# Patient Record
Sex: Male | Born: 1955 | Race: White | Hispanic: No | Marital: Single | Smoking: Former smoker
Health system: Southern US, Community
[De-identification: ages and names within clinical notes are randomized; demographics above are authoritative.]

## PROBLEM LIST (undated history)

## (undated) HISTORY — PX: SHOULDER ACROMIOPLASTY: SHX6093

## (undated) HISTORY — PX: KNEE ARTHROSCOPY W/ MENISCAL REPAIR: SHX1877

## (undated) HISTORY — PX: HERNIA REPAIR: SHX51

---

## 2004-12-19 ENCOUNTER — Encounter: Admission: RE | Admit: 2004-12-19 | Discharge: 2004-12-19 | Payer: Self-pay | Admitting: Gastroenterology

## 2005-01-19 ENCOUNTER — Ambulatory Visit (HOSPITAL_COMMUNITY): Admission: RE | Admit: 2005-01-19 | Discharge: 2005-01-19 | Payer: Self-pay | Admitting: Gastroenterology

## 2005-01-26 ENCOUNTER — Ambulatory Visit (HOSPITAL_COMMUNITY): Admission: RE | Admit: 2005-01-26 | Discharge: 2005-01-26 | Payer: Self-pay | Admitting: Gastroenterology

## 2012-07-24 ENCOUNTER — Ambulatory Visit (INDEPENDENT_AMBULATORY_CARE_PROVIDER_SITE_OTHER): Payer: BC Managed Care – PPO | Admitting: Family Medicine

## 2012-07-24 ENCOUNTER — Encounter: Payer: Self-pay | Admitting: Family Medicine

## 2012-07-24 VITALS — BP 108/71 | HR 52 | Temp 96.8°F | Resp 16 | Ht 73.0 in | Wt 170.0 lb

## 2012-07-24 DIAGNOSIS — Z0000 Encounter for general adult medical examination without abnormal findings: Secondary | ICD-10-CM

## 2012-07-24 LAB — POCT URINALYSIS DIPSTICK
Blood, UA: NEGATIVE
Glucose, UA: NEGATIVE
Leukocytes, UA: NEGATIVE
Nitrite, UA: NEGATIVE
Protein, UA: NEGATIVE
Spec Grav, UA: 1.025
Urobilinogen, UA: 0.2
pH, UA: 5.5

## 2012-07-24 LAB — IFOBT (OCCULT BLOOD): IFOBT: NEGATIVE

## 2012-07-24 NOTE — Patient Instructions (Signed)
Health Maintenance, Males A healthy lifestyle and preventative care can promote health and wellness.  Maintain regular health, dental, and eye exams.  Eat a healthy diet. Foods like vegetables, fruits, whole grains, low-fat dairy products, and lean protein foods contain the nutrients you need without too many calories. Decrease your intake of foods high in solid fats, added sugars, and salt. Get information about a proper diet from your caregiver, if necessary.  Regular physical exercise is one of the most important things you can do for your health. Most adults should get at least 150 minutes of moderate-intensity exercise (any activity that increases your heart rate and causes you to sweat) each week. In addition, most adults need muscle-strengthening exercises on 2 or more days a week.   Maintain a healthy weight. The body mass index (BMI) is a screening tool to identify possible weight problems. It provides an estimate of body fat based on height and weight. Your caregiver can help determine your BMI, and can help you achieve or maintain a healthy weight. For adults 20 years and older:  A BMI below 18.5 is considered underweight.  A BMI of 18.5 to 24.9 is normal.  A BMI of 25 to 29.9 is considered overweight.  A BMI of 30 and above is considered obese.  Maintain normal blood lipids and cholesterol by exercising and minimizing your intake of saturated fat. Eat a balanced diet with plenty of fruits and vegetables. Blood tests for lipids and cholesterol should begin at age 20 and be repeated every 5 years. If your lipid or cholesterol levels are high, you are over 50, or you are a high risk for heart disease, you may need your cholesterol levels checked more frequently.Ongoing high lipid and cholesterol levels should be treated with medicines, if diet and exercise are not effective.  If you smoke, find out from your caregiver how to quit. If you do not use tobacco, do not start.  If you  choose to drink alcohol, do not exceed 2 drinks per day. One drink is considered to be 12 ounces (355 mL) of beer, 5 ounces (148 mL) of wine, or 1.5 ounces (44 mL) of liquor.  Avoid use of street drugs. Do not share needles with anyone. Ask for help if you need support or instructions about stopping the use of drugs.  High blood pressure causes heart disease and increases the risk of stroke. Blood pressure should be checked at least every 1 to 2 years. Ongoing high blood pressure should be treated with medicines if weight loss and exercise are not effective.  If you are 45 to 58 years old, ask your caregiver if you should take aspirin to prevent heart disease.  Diabetes screening involves taking a blood sample to check your fasting blood sugar level. This should be done once every 3 years, after age 45, if you are within normal weight and without risk factors for diabetes. Testing should be considered at a younger age or be carried out more frequently if you are overweight and have at least 1 risk factor for diabetes.  Colorectal cancer can be detected and often prevented. Most routine colorectal cancer screening begins at the age of 50 and continues through age 75. However, your caregiver may recommend screening at an earlier age if you have risk factors for colon cancer. On a yearly basis, your caregiver may provide home test kits to check for hidden blood in the stool. Use of a small camera at the end of a tube,   to directly examine the colon (sigmoidoscopy or colonoscopy), can detect the earliest forms of colorectal cancer. Talk to your caregiver about this at age 50, when routine screening begins. Direct examination of the colon should be repeated every 5 to 10 years through age 75, unless early forms of pre-cancerous polyps or small growths are found.  Hepatitis C blood testing is recommended for all people born from 1945 through 1965 and any individual with known risks for hepatitis C.  Healthy  men should no longer receive prostate-specific antigen (PSA) blood tests as part of routine cancer screening. Consult with your caregiver about prostate cancer screening.  Testicular cancer screening is not recommended for adolescents or adult males who have no symptoms. Screening includes self-exam, caregiver exam, and other screening tests. Consult with your caregiver about any symptoms you have or any concerns you have about testicular cancer.  Practice safe sex. Use condoms and avoid high-risk sexual practices to reduce the spread of sexually transmitted infections (STIs).  Use sunscreen with a sun protection factor (SPF) of 30 or greater. Apply sunscreen liberally and repeatedly throughout the day. You should seek shade when your shadow is shorter than you. Protect yourself by wearing long sleeves, pants, a wide-brimmed hat, and sunglasses year round, whenever you are outdoors.  Notify your caregiver of new moles or changes in moles, especially if there is a change in shape or color. Also notify your caregiver if a mole is larger than the size of a pencil eraser.  A one-time screening for abdominal aortic aneurysm (AAA) and surgical repair of large AAAs by sound wave imaging (ultrasonography) is recommended for ages 65 to 75 years who are current or former smokers.  Stay current with your immunizations. Document Released: 10/13/2007 Document Revised: 07/09/2011 Document Reviewed: 09/11/2010 [employer] Patient Information 2013 [employer].  

## 2012-07-24 NOTE — Progress Notes (Signed)
Patient ID: Corey Lane MRN: [id number], DOB: 01-Apr-1956 57 y.o. Date of Encounter: 07/24/2012, 9:20 AM  Primary Physician: No primary provider on file.  Chief Complaint: Physical (CPE)  HPI: 57 y.o. y/o male with history noted below here for CPE.  Doing well.  H/O migraines (very occasional, uses Maxalt MLT prn) 3-4 per year.  Onset at 57 years old  Colonoscopy:  7 years ago Last dt:  Unknown Eyes:  [place] Seasonal affective disorder:  Treated by Dr. Len Blalock (buproprion 150 during summer, 300 during winter)   Review of Systems: Consitutional: No fever, chills, fatigue, night sweats, lymphadenopathy, or weight changes. Eyes: No visual changes, eye redness, or discharge. ENT/Mouth: Ears: No otalgia, tinnitus, hearing loss, discharge. Nose: No congestion, rhinorrhea, sinus pain, or epistaxis. Throat: No sore throat, post nasal drip, or teeth pain. Cardiovascular: No CP, palpitations, diaphoresis, DOE, edema, orthopnea, PND. Respiratory: No cough, hemoptysis, SOB, or wheezing. Gastrointestinal: No anorexia, dysphagia, reflux, pain, nausea, vomiting, hematemesis, diarrhea, BRBPR, or melena.  BM q 3 days when on buproprion Genitourinary: No dysuria, frequency, urgency, hematuria, incontinence, nocturia, decreased urinary stream, discharge, impotence, or testicular pain/masses. Musculoskeletal: No decreased ROM, myalgias, stiffness, joint swelling, or weakness. Skin: No rash, erythema, lesion changes, pain, warmth, jaundice, or pruritis. Neurological: No headache, dizziness, syncope, seizures, tremors, memory loss, coordination problems, or paresthesias. Psychological: No anxiety, depression, hallucinations, SI/HI. Endocrine: No fatigue, polydipsia, polyphagia, polyuria, or known diabetes. All other systems were reviewed and are otherwise negative.  No past medical history on file.   Past Surgical History  Procedure Laterality Date  . Knee arthroscopy w/ meniscal repair       Home Meds:  Prior to Admission medications   Medication Sig Start Date End Date Taking? Authorizing Provider  buPROPion (WELLBUTRIN XL) 300 MG 24 hr tablet Take 300 mg by mouth daily.   Yes Historical Provider, MD    Allergies: No Known Allergies  History   Social History  . Marital Status: Single    Spouse Name: N/A    Number of Children: N/A  . Years of Education: N/A   Occupational History  . Physiological scientist    Social History Main Topics  . Smoking status: Former Games developer  . Smokeless tobacco: Not on file  . Alcohol Use: Yes  . Drug Use: No  . Sexually Active: Not on file   Other Topics Concern  . Not on file   Social History Narrative  . No narrative on file    No family history on file.  Physical Exam: Blood pressure 108/71, pulse 52, temperature 96.8 F (36 C), resp. rate 16, height 6\' 1"  (1.854 m), weight 170 lb (77.111 kg).  General: Well developed, well nourished, in no acute distress. HEENT: Normocephalic, atraumatic. Conjunctiva pink, sclera non-icteric. Pupils 2 mm constricting to 1 mm, round, regular, and equally reactive to light and accomodation. EOMI. Internal auditory canal clear. TMs with good cone of light and without pathology. Nasal mucosa pink. Nares are without discharge. No sinus tenderness. Oral mucosa pink. Dentition good. Pharynx without exudate.   Neck: Supple. Trachea midline. No thyromegaly. Full ROM. No lymphadenopathy. Lungs: Clear to auscultation bilaterally without wheezes, rales, or rhonchi. Breathing is of normal effort and unlabored. Cardiovascular: RRR with S1 S2. No murmurs, rubs, or gallops appreciated. Distal pulses 2+ symmetrically. No carotid or abdominal bruits Abdomen: Soft, non-tender, non-distended with normoactive bowel sounds. No hepatosplenomegaly or masses. No rebound/guarding. No CVA tenderness. Without hernias.  Rectal: No external hemorrhoids or  fissures. Rectal vault without masses.  Genitourinary:   circumcised male. No penile lesions. Testes descended bilaterally, and smooth without tenderness or masses.  Musculoskeletal: Full range of motion and 5/5 strength throughout. Without swelling, atrophy, tenderness, crepitus, or warmth. Extremities without clubbing, cyanosis, or edema. Calves supple. Skin: Warm and moist without erythema, ecchymosis, wounds, or rash. Neuro: A+Ox3. CN II-XII grossly intact. Moves all extremities spontaneously. Full sensation throughout. Normal gait. DTR 2+ throughout upper and lower extremities. Finger to nose intact. Psych:  Responds to questions appropriately with a normal affect.    UA:  Results for orders placed in visit on 07/24/12  POCT URINALYSIS DIPSTICK      Result Value Range   Color, UA yellow     Clarity, UA clear     Glucose, UA neg     Bilirubin, UA small     Ketones, UA trace     Spec Grav, UA 1.025     Blood, UA neg     pH, UA 5.5     Protein, UA neg     Urobilinogen, UA 0.2     Nitrite, UA neg     Leukocytes, UA Negative    IFOBT (OCCULT BLOOD)      Result Value Range   IFOBT Negative       Assessment/Plan:  57 y.o. y/o  male here for CPE -Routine general medical examination at a health care facility - Plan: POCT urinalysis dipstick, IFOBT POC (occult bld, rslt in office), CANCELED: CBC, CANCELED: Comprehensive metabolic panel, CANCELED: Lipid panel, CANCELED: PSA    Signed, Elvina Sidle, MD 07/24/2012 9:20 AM

## 2014-05-05 ENCOUNTER — Ambulatory Visit (INDEPENDENT_AMBULATORY_CARE_PROVIDER_SITE_OTHER): Payer: BLUE CROSS/BLUE SHIELD | Admitting: Family Medicine

## 2014-05-05 VITALS — BP 126/80 | HR 71 | Temp 98.0°F | Resp 17 | Ht 72.5 in | Wt 179.0 lb

## 2014-05-05 DIAGNOSIS — J0110 Acute frontal sinusitis, unspecified: Secondary | ICD-10-CM

## 2014-05-05 MED ORDER — AMOXICILLIN 500 MG PO CAPS: 1000.0000 mg | ORAL_CAPSULE | Freq: Two times a day (BID) | ORAL | Status: AC

## 2014-05-05 NOTE — Progress Notes (Signed)
[facility] [address] [place] [address] [phone]  Date:  05/05/2014   Name:  Corey Lane   DOB:  August 04, 1955   MRN:  [id number]  PCP:  No primary care provider on file.    Chief Complaint: Sinusitis; Dental Pain; Ear Fullness; and Generalized Body Aches   History of Present Illness:  Corey Lane is a 59 y.o. very pleasant male patient who presents with the following:  He has been ill for a couple of weeks with sinus pressure and pain, pain in his teeth, and joint aches.  The sx have moved from his throat, to his chest and now settled in his sinuses over the last week.   He is blowing out dark green phelgm.  He feels feverish but his temp has been normal  His sx are worst at night No cough- he did have a cough but this is now resolved No GI symptoms He has noted fatigue, he is not sleeping well   His wife has been ill recently as well  There are no active problems to display for this patient.   No past medical history on file.  Past Surgical History  Procedure Laterality Date  . Knee arthroscopy w/ meniscal repair      History  Substance Use Topics  . Smoking status: Former Research scientist (life sciences)  . Smokeless tobacco: Not on file  . Alcohol Use: Yes    No family history on file.  No Known Allergies  Medication list has been reviewed and updated.  No current outpatient prescriptions on file prior to visit.   No current facility-administered medications on file prior to visit.    Review of Systems:  As per HPI- otherwise negative.   Physical Examination: Filed Vitals:   05/05/14 1524  BP: 126/80  Pulse: 71  Temp: 98 F (36.7 C)  Resp: 17   Filed Vitals:   05/05/14 1524  Height: 6' 0.5" (1.842 m)  Weight: 179 lb (81.194 kg)   Body mass index is 23.93 kg/(m^2). Ideal Body Weight: Weight in (lb) to have BMI = 25: 186.5  GEN: WDWN, NAD, Non-toxic, A & O x 3, looks well HEENT: Atraumatic, Normocephalic. Neck supple. No masses,  No LAD.  Bilateral TM wnl, oropharynx normal.  PEERL,EOMI.   Sinuses are tender to percussion, purulent nasal discharge Ears and Nose: No external deformity. CV: RRR, No M/G/R. No JVD. No thrill. No extra heart sounds. PULM: CTA B, no wheezes, crackles, rhonchi. No retractions. No resp. distress. No accessory muscle use. EXTR: No c/c/e NEURO Normal gait.  PSYCH: Normally interactive. Conversant. Not depressed or anxious appearing.  Calm demeanor.    Assessment and Plan: Acute frontal sinusitis, recurrence not specified - Plan: amoxicillin (AMOXIL) 500 MG capsule  Treat for sinus infection with amoxicillin See patient instructions for more details.     Signed Lamar Blinks, MD

## 2014-05-05 NOTE — Patient Instructions (Signed)
We are going to treat you for a sinus infection with amoxicillin- take as directed.  Let me know if you do not feel better in the next few days You might also try some mucinex, ibuprofen for pain

## 2014-09-29 ENCOUNTER — Ambulatory Visit (INDEPENDENT_AMBULATORY_CARE_PROVIDER_SITE_OTHER): Payer: BLUE CROSS/BLUE SHIELD | Admitting: Family Medicine

## 2014-09-29 ENCOUNTER — Ambulatory Visit (HOSPITAL_COMMUNITY): Payer: BLUE CROSS/BLUE SHIELD

## 2014-09-29 ENCOUNTER — Encounter (HOSPITAL_BASED_OUTPATIENT_CLINIC_OR_DEPARTMENT_OTHER): Payer: Self-pay | Admitting: Emergency Medicine

## 2014-09-29 ENCOUNTER — Emergency Department (HOSPITAL_BASED_OUTPATIENT_CLINIC_OR_DEPARTMENT_OTHER): Payer: BLUE CROSS/BLUE SHIELD

## 2014-09-29 ENCOUNTER — Emergency Department (HOSPITAL_BASED_OUTPATIENT_CLINIC_OR_DEPARTMENT_OTHER)
Admission: EM | Admit: 2014-09-29 | Discharge: 2014-09-29 | Disposition: A | Discharge status: Home / Self Care | Payer: BLUE CROSS/BLUE SHIELD | Attending: Emergency Medicine | Admitting: Emergency Medicine

## 2014-09-29 VITALS — BP 138/96 | HR 87 | Temp 97.4°F | Resp 22

## 2014-09-29 DIAGNOSIS — Z79899 Other long term (current) drug therapy: Secondary | ICD-10-CM | POA: Diagnosis not present

## 2014-09-29 DIAGNOSIS — Z87891 Personal history of nicotine dependence: Secondary | ICD-10-CM | POA: Insufficient documentation

## 2014-09-29 DIAGNOSIS — N201 Calculus of ureter: Secondary | ICD-10-CM

## 2014-09-29 DIAGNOSIS — R1031 Right lower quadrant pain: Secondary | ICD-10-CM

## 2014-09-29 DIAGNOSIS — R109 Unspecified abdominal pain: Secondary | ICD-10-CM | POA: Diagnosis present

## 2014-09-29 LAB — COMPREHENSIVE METABOLIC PANEL
ALK PHOS: 61 U/L (ref 38–126)
ALT: 23 U/L (ref 17–63)
ANION GAP: 8 (ref 5–15)
AST: 26 U/L (ref 15–41)
Albumin: 4.2 g/dL (ref 3.5–5.0)
BUN: 19 mg/dL (ref 6–20)
CHLORIDE: 103 mmol/L (ref 101–111)
CO2: 28 mmol/L (ref 22–32)
CREATININE: 1.19 mg/dL (ref 0.61–1.24)
Calcium: 9.2 mg/dL (ref 8.9–10.3)
GFR calc Af Amer: >60 mL/min (ref >60)
GLUCOSE: 110 mg/dL — AB (ref 65–99)
POTASSIUM: 4.4 mmol/L (ref 3.5–5.1)
Sodium: 139 mmol/L (ref 135–145)
Total Bilirubin: 0.5 mg/dL (ref 0.3–1.2)
Total Protein: 7.1 g/dL (ref 6.5–8.1)

## 2014-09-29 LAB — POCT URINALYSIS DIPSTICK
BILIRUBIN UA: NEGATIVE
GLUCOSE UA: NEGATIVE
NITRITE UA: NEGATIVE
PROTEIN UA: NEGATIVE
Spec Grav, UA: 1.025
UROBILINOGEN UA: 0.2
pH, UA: 5.5

## 2014-09-29 LAB — POCT CBC
GRANULOCYTE PERCENT: 45.9 % (ref 37–80)
HCT, POC: 42.3 % — AB (ref 43.5–53.7)
Hemoglobin: 14 g/dL — AB (ref 14.1–18.1)
Lymph, poc: 3.2 (ref 0.6–3.4)
MCH: 31 pg (ref 27–31.2)
MCHC: 33.2 g/dL (ref 31.8–35.4)
MCV: 93.3 fL (ref 80–97)
MID (cbc): 0.3 (ref 0–0.9)
MPV: 7.6 fL (ref 0–99.8)
PLATELET COUNT, POC: 245 10*3/uL (ref 142–424)
POC Granulocyte: 3 (ref 2–6.9)
POC LYMPH PERCENT: 49.7 %L (ref 10–50)
POC MID %: 4.4 %M (ref 0–12)
RBC: 4.53 M/uL — AB (ref 4.69–6.13)
RDW, POC: 13.6 %
WBC: 6.5 10*3/uL (ref 4.6–10.2)

## 2014-09-29 LAB — POCT UA - MICROSCOPIC ONLY
Casts, Ur, LPF, POC: NEGATIVE
Crystals, Ur, HPF, POC: NEGATIVE
Epithelial cells, urine per micros: NEGATIVE
MUCUS UA: NEGATIVE
YEAST UA: NEGATIVE

## 2014-09-29 LAB — CBC
HCT: 41.3 % (ref 39.0–52.0)
Hemoglobin: 13.9 g/dL (ref 13.0–17.0)
MCH: 31.6 pg (ref 26.0–34.0)
MCHC: 33.7 g/dL (ref 30.0–36.0)
MCV: 93.9 fL (ref 78.0–100.0)
Platelets: 204 10*3/uL (ref 150–400)
RBC: 4.4 MIL/uL (ref 4.22–5.81)
RDW: 12.6 % (ref 11.5–15.5)
WBC: 7.6 10*3/uL (ref 4.0–10.5)

## 2014-09-29 MED ORDER — ONDANSETRON 4 MG PO TBDP: 4.0000 mg | ORAL_TABLET | Freq: Three times a day (TID) | ORAL | Status: AC | PRN

## 2014-09-29 MED ORDER — SODIUM CHLORIDE 0.9 % IV BOLUS (SEPSIS)
1000.0000 mL | Freq: Once | INTRAVENOUS | Status: AC
  Administered 2014-09-29: 1000 mL via INTRAVENOUS

## 2014-09-29 MED ORDER — ONDANSETRON HCL 4 MG/2ML IJ SOLN
4.0000 mg | Freq: Once | INTRAMUSCULAR | Status: AC
  Administered 2014-09-29: 4 mg via INTRAVENOUS

## 2014-09-29 MED ORDER — TAMSULOSIN HCL 0.4 MG PO CAPS: 0.4000 mg | ORAL_CAPSULE | Freq: Every day | ORAL | Status: DC

## 2014-09-29 MED ORDER — FENTANYL CITRATE (PF) 100 MCG/2ML IJ SOLN
50.0000 ug | Freq: Once | INTRAMUSCULAR | Status: AC
  Administered 2014-09-29: 50 ug via INTRAVENOUS

## 2014-09-29 MED ORDER — FENTANYL CITRATE (PF) 100 MCG/2ML IJ SOLN
INTRAMUSCULAR | Status: AC
  Filled 2014-09-29: qty 2

## 2014-09-29 MED ORDER — OXYCODONE-ACETAMINOPHEN 5-325 MG PO TABS: 1.0000 | ORAL_TABLET | Freq: Four times a day (QID) | ORAL | Status: AC | PRN

## 2014-09-29 MED ORDER — ONDANSETRON HCL 4 MG/2ML IJ SOLN
INTRAMUSCULAR | Status: AC
  Filled 2014-09-29: qty 2

## 2014-09-29 MED ORDER — HYDROMORPHONE HCL 1 MG/ML IJ SOLN
1.0000 mg | Freq: Once | INTRAMUSCULAR | Status: AC
  Administered 2014-09-29: 1 mg via INTRAVENOUS
  Filled 2014-09-29: qty 1

## 2014-09-29 MED ORDER — HYDROMORPHONE HCL 1 MG/ML IJ SOLN
INTRAMUSCULAR | Status: AC
  Filled 2014-09-29: qty 1

## 2014-09-29 MED ORDER — HYDROMORPHONE HCL 1 MG/ML IJ SOLN
1.0000 mg | Freq: Once | INTRAMUSCULAR | Status: AC
  Administered 2014-09-29: 1 mg via INTRAVENOUS

## 2014-09-29 MED ORDER — KETOROLAC TROMETHAMINE 30 MG/ML IJ SOLN
30.0000 mg | Freq: Once | INTRAMUSCULAR | Status: AC
  Administered 2014-09-29: 30 mg via INTRAMUSCULAR

## 2014-09-29 NOTE — ED Provider Notes (Signed)
CSN: [id number]     Arrival date & time 09/29/14  1353 History   First MD Initiated Contact with Patient 09/29/14 1451     Chief Complaint  Patient presents with  . Flank Pain    right side     (Consider location/radiation/quality/duration/timing/severity/associated sxs/prior Treatment) HPI Comments: Denies history of stone  Patient is a 59 y.o. male presenting with flank pain. No language interpreter was used.  Flank Pain This is a new problem. The current episode started today. The problem occurs constantly. The problem has been unchanged. Associated symptoms include abdominal pain. Pertinent negatives include no fever, numbness, vomiting or weakness. Nothing aggravates the symptoms. He has tried nothing for the symptoms.    History reviewed. No pertinent past medical history. Past Surgical History  Procedure Laterality Date  . Knee arthroscopy w/ meniscal repair    . Hernia repair    . Shoulder acromioplasty Left    History reviewed. No pertinent family history. History  Substance Use Topics  . Smoking status: Former Research scientist (life sciences)  . Smokeless tobacco: Not on file  . Alcohol Use: Yes    Review of Systems  Constitutional: Negative for fever.  Gastrointestinal: Positive for abdominal pain. Negative for vomiting.  Genitourinary: Positive for flank pain.  Neurological: Negative for weakness and numbness.  All other systems reviewed and are negative.     Allergies  Review of patient's allergies indicates no known allergies.  Home Medications   Prior to Admission medications   Medication Sig Start Date End Date Taking? Authorizing Provider  amoxicillin (AMOXIL) 500 MG capsule Take 2 capsules (1,000 mg total) by mouth 2 (two) times daily. Patient not taking: Reported on 09/29/2014 05/05/14   Darreld Mclean, MD  oxyCODONE-acetaminophen (ROXICET) 5-325 MG per tablet Take 1-2 tablets by mouth every 6 (six) hours as needed for severe pain. 09/29/14   Darreld Mclean, MD   tamsulosin (FLOMAX) 0.4 MG CAPS capsule Take 1 capsule (0.4 mg total) by mouth daily. 09/29/14   Gay Filler Copland, MD   BP 125/65 mmHg  Pulse 48  Temp(Src) 98.1 F (36.7 C) (Oral)  Resp 18  Ht 6\' 2"  (1.88 m)  Wt 170 lb (77.111 kg)  BMI 21.82 kg/m2  SpO2 100% Physical Exam  Constitutional: He is oriented to person, place, and time. He appears well-developed and well-nourished. He appears distressed.  Cardiovascular: Normal rate and regular rhythm.   Pulmonary/Chest: Effort normal and breath sounds normal.  Abdominal: Soft. Bowel sounds are normal. There is no tenderness.  Musculoskeletal: Normal range of motion.  Neurological: He is alert and oriented to person, place, and time.  Skin: Skin is warm and dry.  Psychiatric: He has a normal mood and affect.  Nursing note and vitals reviewed.   ED Course  Procedures (including critical care time) Labs Review Labs Reviewed  COMPREHENSIVE METABOLIC PANEL - Abnormal; Notable for the following:    Glucose, Bld 110 (*)    All other components within normal limits  CBC  URINALYSIS, ROUTINE W REFLEX MICROSCOPIC (NOT AT [facility])    Imaging Review Ct Renal Stone Study  09/29/2014   CLINICAL DATA:  Right flank pain with nausea for 1 day  EXAM: CT ABDOMEN AND PELVIS WITHOUT CONTRAST  TECHNIQUE: Multidetector CT imaging of the abdomen and pelvis was performed following the standard protocol without oral or intravenous contrast maternal administration.  COMPARISON:  December 19, 2004  FINDINGS: Lung bases are clear.  Liver is prominent, measuring 18.9 cm in length. No focal  liver lesions are identified on this noncontrast enhanced study. Gallbladder wall is not thickened. There is no appreciable biliary duct dilatation.  Spleen, pancreas, and adrenals appear normal.  Left kidney shows no evidence of mass or hydronephrosis. There is an extrarenal pelvis the left, stable. There is no left-sided renal or ureteral calculus. On the right, there is an  extrarenal pelvis. There is moderate hydronephrosis on the right as well. No right renal mass. There is no intrarenal calculus on the right. There is a 2 mm calculus in the right ureterovesical junction. Several vascular calcifications are also present on the right, separate from the right ureter.  In the pelvis, the urinary bladder is midline with normal wall thickness. There are prostatic calculi. Prostate appears mildly prominent. There is no pelvic mass or pelvic fluid collection. There is no periappendiceal region inflammation. Appendix is not well seen.  There is diffuse stool throughout the colon.  There is no bowel obstruction.  No free air or portal venous air.  There is no appreciable ascites, adenopathy, or abscess in the abdomen or pelvis. There is no abdominal aortic aneurysm. There is degenerative change in the lumbar spine. There is slight anterolisthesis of L3 on L4. There is vacuum phenomenon at L4-5. There are no blastic or lytic bone lesions.  IMPRESSION: 2 mm calculus right ureterovesical junction with moderate hydronephrosis on the right.  Extensive stool throughout colon consistent with constipation.  No bowel obstruction. No abscess. Periappendiceal region appears within normal limits.  Multiple prostatic calculi. Prostate appears rather prominent. This finding may warrant correlation with PSA.   Electronically Signed   By: Lowella Grip III M.D.   On: 09/29/2014 15:20     EKG Interpretation None      MDM   Final diagnoses:  Ureteral stone    Pt is feeling better a this time. Discussed ct findings with pt including enlarged prostate. Pt was given flomax and oxycodone by urgent care earlier today. Given zofran script here and urology follow up    Glendell Docker, NP 09/29/14 [place], MD 09/29/14 1735

## 2014-09-29 NOTE — Discharge Instructions (Signed)

## 2014-09-29 NOTE — Progress Notes (Addendum)
[facility] [address] [place] [address] [phone]  Date:  09/29/2014   Name:  Corey Lane   DOB:  1955-12-21   MRN:  [id number]  PCP:  No primary care provider on file.    Chief Complaint: Abdominal Pain   History of Present Illness:  Corey Lane is a 59 y.o. very pleasant male patient who presents with the following:  Here today with pain in his RLQ. He woke up just before 5 am with mild pain.  He went about his day, pain was mild through the morning but then got worse as the day went on.  He was in a lot of pain over the last hour.  It will come and go, but can get very severe.  It seems to come in "waves."   Yesterday everytrhing seemed to be ok No history of kidney stone in the past He did not eat today- he did not feel like eating No vomiting He did have a left inguinal hernia repaire about 10 years ago- OW no abd operations No hematuria, no pain with urination.    He is generally in good health.   He used ibuprofen 800 about 9:30am.  Discussed with pharm D- ok to use toradol as he does not take the ibuprofen on a regular basis  Wt Readings from Last 3 Encounters:  05/05/14 179 lb (81.194 kg)  07/24/12 170 lb (77.111 kg)   Most recent BMI 23   There are no active problems to display for this patient.   No past medical history on file.  Past Surgical History  Procedure Laterality Date  . Knee arthroscopy w/ meniscal repair      History  Substance Use Topics  . Smoking status: Former Research scientist (life sciences)  . Smokeless tobacco: Not on file  . Alcohol Use: Yes    No family history on file.  No Known Allergies  Medication list has been reviewed and updated.  Current Outpatient Prescriptions on File Prior to Visit  Medication Sig Dispense Refill  . amoxicillin (AMOXIL) 500 MG capsule Take 2 capsules (1,000 mg total) by mouth 2 (two) times daily. (Patient not taking: Reported on 09/29/2014) 40 capsule 0   No current facility-administered  medications on file prior to visit.    Review of Systems:  As per HPI- otherwise negative.   Physical Examination: Filed Vitals:   09/29/14 1203  BP: 138/96  Pulse: 87  Resp: 22   There were no vitals filed for this visit. There is no weight on file to calculate BMI. Ideal Body Weight:    GEN: WDWN, NAD, Non-toxic, A & O x 3, normal weight, appears well except for obvious pain HEENT: Atraumatic, Normocephalic. Neck supple. No masses, No LAD. Ears and Nose: No external deformity. CV: RRR, No M/G/R. No JVD. No thrill. No extra heart sounds. PULM: CTA B, no wheezes, crackles, rhonchi. No retractions. No resp. distress. No accessory muscle use. ABD: S,ND, +BS. No rebound. No HSM.  Mild tenderness in the RLQ- however when he does have pain it comes in severe waves and he is writhing, cannot be examined during wave of pain.  He also endorses some pain into his back  EXTR: No c/c/e NEURO Normal gait.  PSYCH: Normally interactive. Conversant. Not depressed or anxious appearing.  Calm demeanor.  GU: no testicular masses or tenderness. No inguinal hernia appreciated   Given toradol 30 mg in clinic. However this did not relieve his pain really  at all.  We do not have stronger pain relievers here at clinic.  Had planned to send him for an outpt CT but decided to have him go to the ER for further treatment instead given his pain.   Assessment and Plan: RLQ abdominal pain - Plan: POCT CBC, POCT UA - Microscopic Only, POCT urinalysis dipstick, Urine culture, Comprehensive metabolic panel, ketorolac (TORADOL) 30 MG/ML injection 30 mg, tamsulosin (FLOMAX) 0.4 MG CAPS capsule, oxyCODONE-acetaminophen (ROXICET) 5-325 MG per tablet, CT Abdomen Pelvis Wo Contrast  Severe spasmodic RLQ pain- suspect a kidney stone given nature of his pain, does not seem typical of appendicitis.  Gave him toradol which did not help his pain.   He will go to the ER for pain relief and further evaluation of his pain.     Signed Lamar Blinks, MD  Called 10/02/14 and [facility]- labs look ok, will mail him a copy  Results for orders placed or performed in visit on 09/29/14  Urine culture  Result Value Ref Range   Colony Count NO GROWTH    Organism ID, Bacteria NO GROWTH   Comprehensive metabolic panel  Result Value Ref Range   Sodium 138 135 - 145 mEq/L   Potassium 4.6 3.5 - 5.3 mEq/L   Chloride 104 96 - 112 mEq/L   CO2 25 19 - 32 mEq/L   Glucose, Bld 95 70 - 99 mg/dL   BUN 18 6 - 23 mg/dL   Creat 1.12 0.50 - 1.35 mg/dL   Total Bilirubin 0.5 0.2 - 1.2 mg/dL   Alkaline Phosphatase 64 39 - 117 U/L   AST 25 0 - 37 U/L   ALT 20 0 - 53 U/L   Total Protein 6.5 6.0 - 8.3 g/dL   Albumin 4.1 3.5 - 5.2 g/dL   Calcium 10.0 8.4 - 10.5 mg/dL  POCT CBC  Result Value Ref Range   WBC 6.5 4.6 - 10.2 K/uL   Lymph, poc 3.2 0.6 - 3.4   POC LYMPH PERCENT 49.7 10 - 50 %L   MID (cbc) 0.3 0 - 0.9   POC MID % 4.4 0 - 12 %M   POC Granulocyte 3.0 2 - 6.9   Granulocyte percent 45.9 37 - 80 %G   RBC 4.53 (A) 4.69 - 6.13 M/uL   Hemoglobin 14.0 (A) 14.1 - 18.1 g/dL   HCT, POC 42.3 (A) 43.5 - 53.7 %   MCV 93.3 80 - 97 fL   MCH, POC 31.0 27 - 31.2 pg   MCHC 33.2 31.8 - 35.4 g/dL   RDW, POC 13.6 %   Platelet Count, POC 245 142 - 424 K/uL   MPV 7.6 0 - 99.8 fL  POCT UA - Microscopic Only  Result Value Ref Range   WBC, Ur, HPF, POC 0-1    RBC, urine, microscopic 1-3    Bacteria, U Microscopic trace    Mucus, UA neg    Epithelial cells, urine per micros neg    Crystals, Ur, HPF, POC neg    Casts, Ur, LPF, POC neg    Yeast, UA neg   POCT urinalysis dipstick  Result Value Ref Range   Color, UA yellow    Clarity, UA clear    Glucose, UA neg    Bilirubin, UA neg    Ketones, UA trace    Spec Grav, UA 1.025    Blood, UA trace-intact    pH, UA 5.5    Protein, UA neg    Urobilinogen, UA 0.2  Nitrite, UA neg    Leukocytes, UA Trace

## 2014-09-29 NOTE — ED Notes (Signed)
Right side flank pain started around 4am this morning just getting worse throughout the day, went to urgent care and was told to come here.

## 2014-09-29 NOTE — Patient Instructions (Addendum)
You most likely have a kidney stone.  We want to see this on CT to confirm that there is a stone and also to determine the size of the stone  Please go to the ER of your choice (you might try the [facility]- they may be able to see you faster.    [facility] ?   Address: [address], [place], [place] [address]  [phone]  Assuming that you are able to go home you can use the flomax once a day to help the stone pass and the oxycodone as needed for pain

## 2014-09-30 LAB — COMPREHENSIVE METABOLIC PANEL
ALBUMIN: 4.1 g/dL (ref 3.5–5.2)
ALK PHOS: 64 U/L (ref 39–117)
ALT: 20 U/L (ref 0–53)
AST: 25 U/L (ref 0–37)
BUN: 18 mg/dL (ref 6–23)
CALCIUM: 10 mg/dL (ref 8.4–10.5)
CHLORIDE: 104 meq/L (ref 96–112)
CO2: 25 mEq/L (ref 19–32)
Creat: 1.12 mg/dL (ref 0.50–1.35)
Glucose, Bld: 95 mg/dL (ref 70–99)
Potassium: 4.6 mEq/L (ref 3.5–5.3)
SODIUM: 138 meq/L (ref 135–145)
TOTAL PROTEIN: 6.5 g/dL (ref 6.0–8.3)
Total Bilirubin: 0.5 mg/dL (ref 0.2–1.2)

## 2014-10-01 LAB — URINE CULTURE
COLONY COUNT: NO GROWTH
Organism ID, Bacteria: NO GROWTH

## 2014-10-03 ENCOUNTER — Encounter: Payer: Self-pay | Admitting: Family Medicine

## 2017-01-12 IMAGING — CT CT RENAL STONE PROTOCOL
2 of 4 series · 16 of 46 positions shown, 18 images · non-contrast
Comparison: December 19, 2004

CLINICAL DATA: Right flank pain with nausea for 1 day

EXAM:
CT ABDOMEN AND PELVIS WITHOUT CONTRAST
TECHNIQUE: Multidetector CT imaging of the abdomen and pelvis was performed
following the standard protocol without oral or intravenous contrast
maternal administration.

[Series 2: renal stone > 200 lbs 5.0 b31f · axial · 0.70mm/px · z∈[+652,+1052]mm · 13 of 88 slices shown, 15 images]
[im 4/88  soft-tissue]
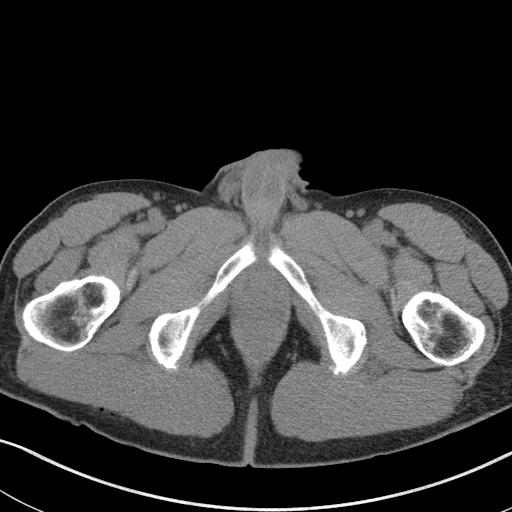
[im 4/88  bone]
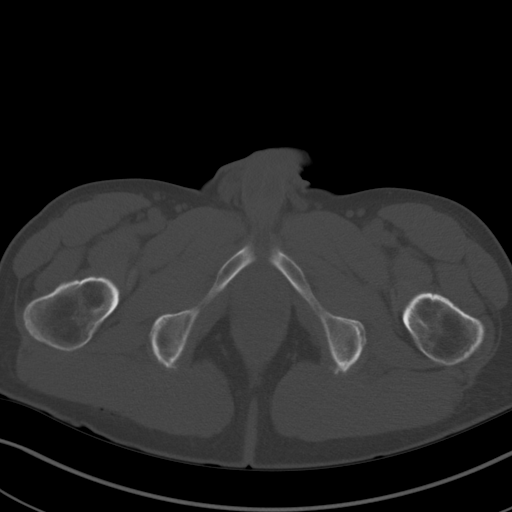
[im 11/88  soft-tissue]
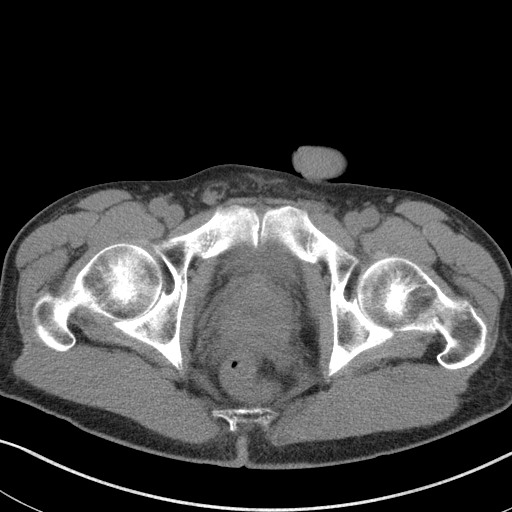
[im 18/88  soft-tissue]
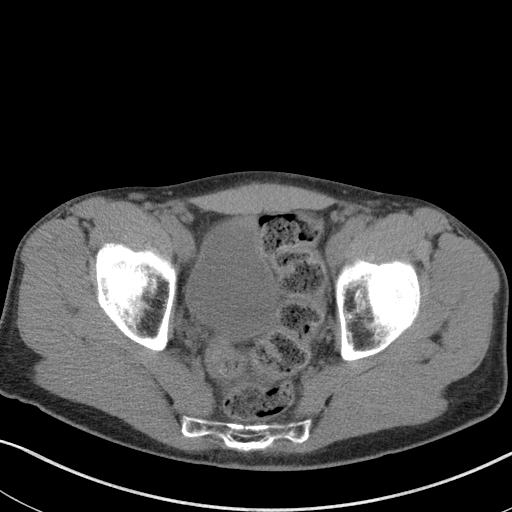
[im 25/88  soft-tissue]
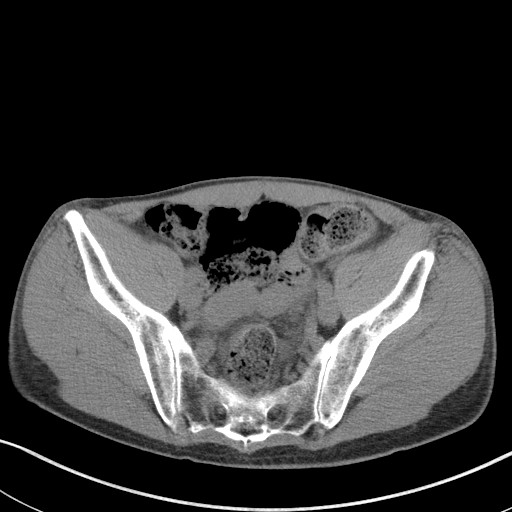
[im 32/88  soft-tissue]
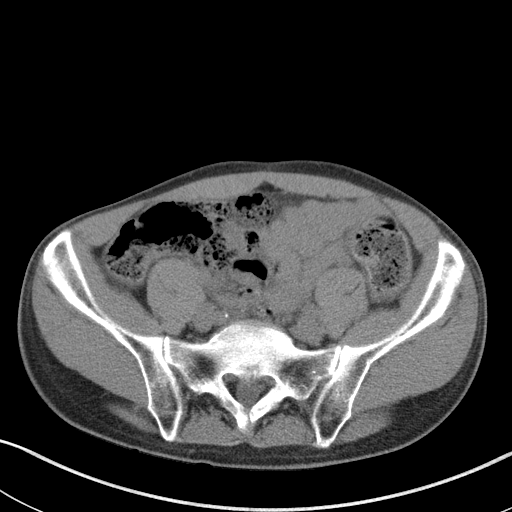
[im 39/88  soft-tissue]
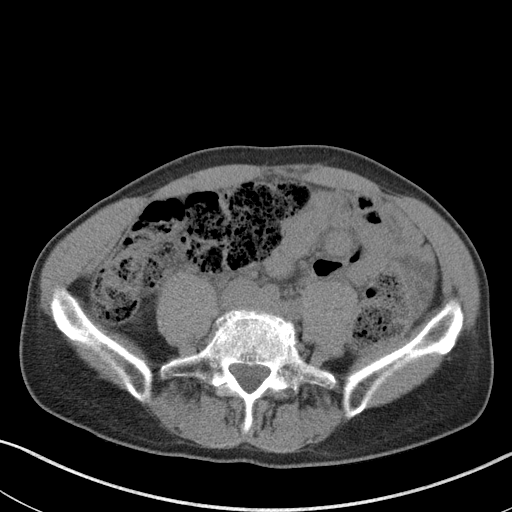
[im 46/88  soft-tissue]
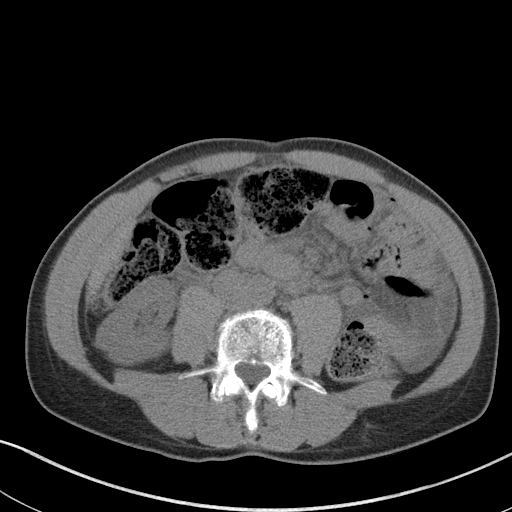
[im 49/88  soft-tissue]
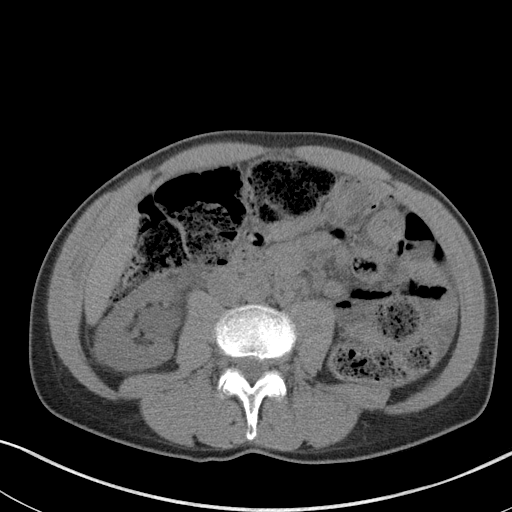
[im 56/88  soft-tissue]
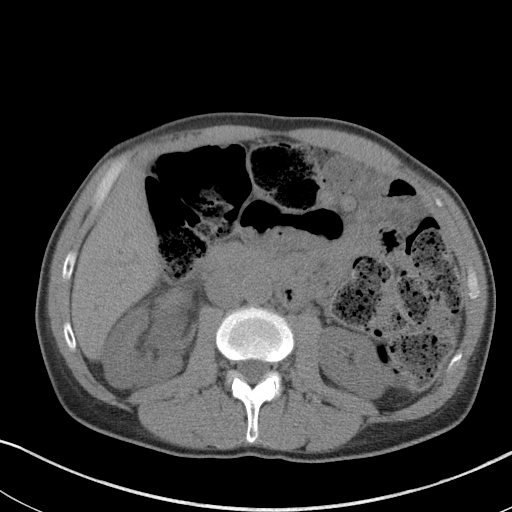
[im 56/88  bone]
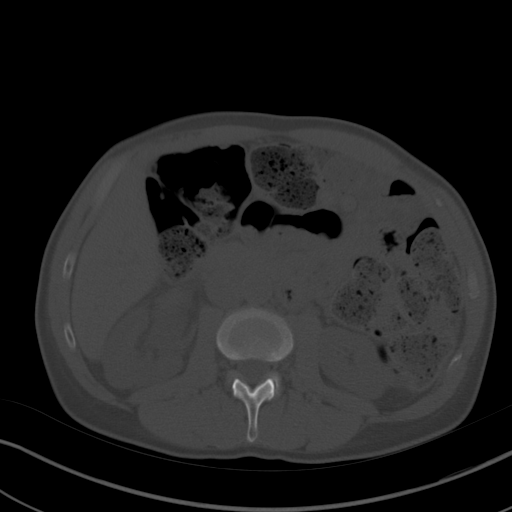
[im 63/88  soft-tissue]
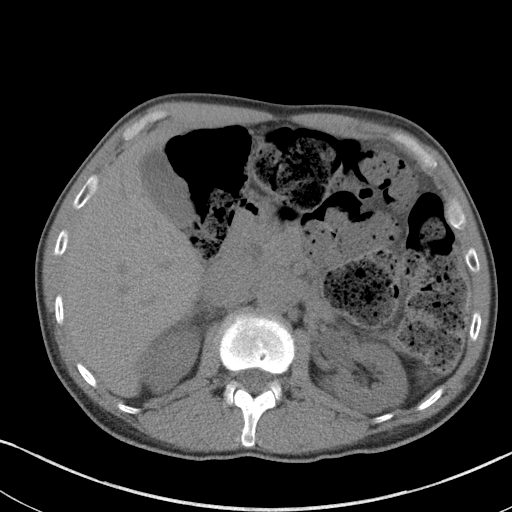
[im 70/88  soft-tissue]
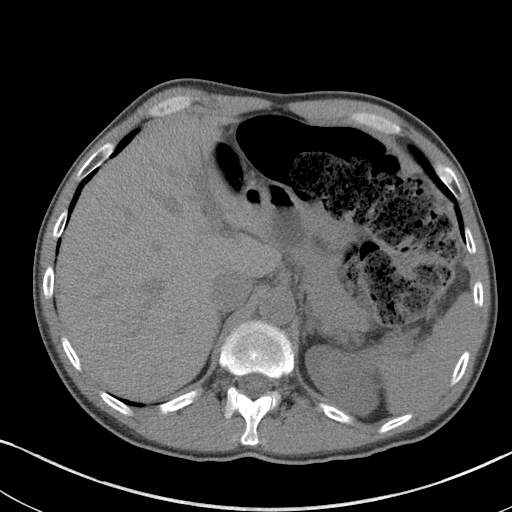
[im 77/88  soft-tissue]
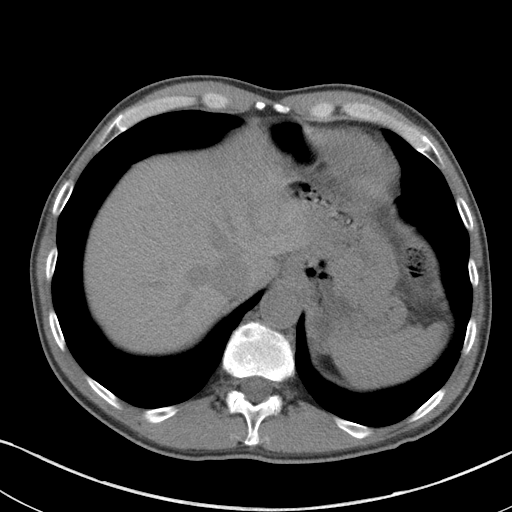
[im 84/88  soft-tissue]
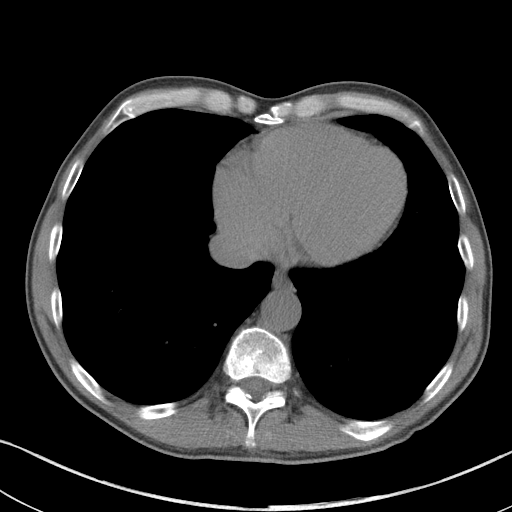

[Series 5: renal stone 3.0 coronal · coronal · 0.77mm/px · 3 of 92 slices shown]
[im 31/92  soft-tissue]
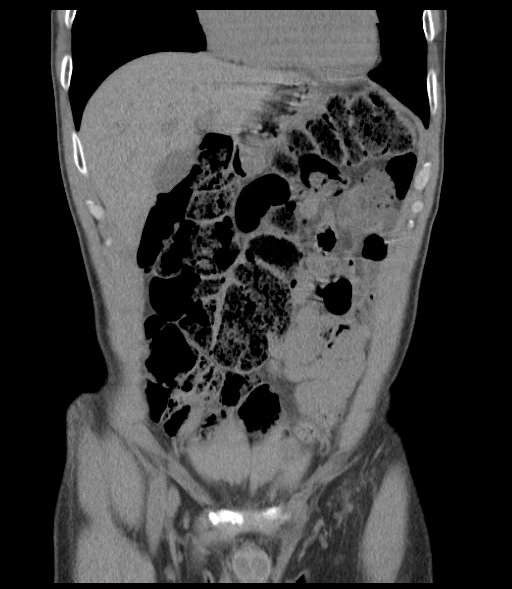
[im 41/92  soft-tissue]
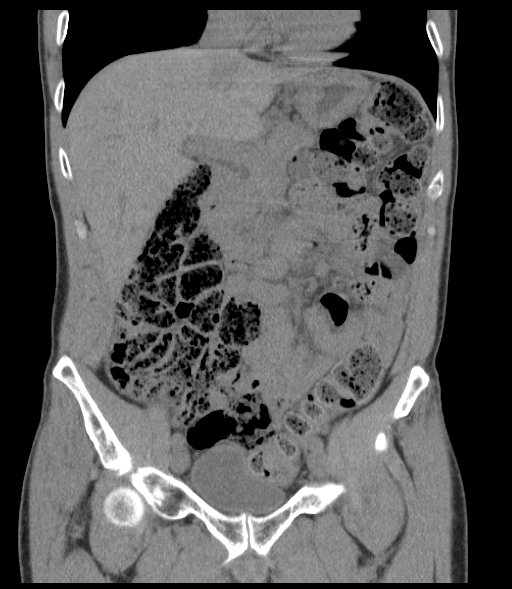
[im 51/92  soft-tissue]
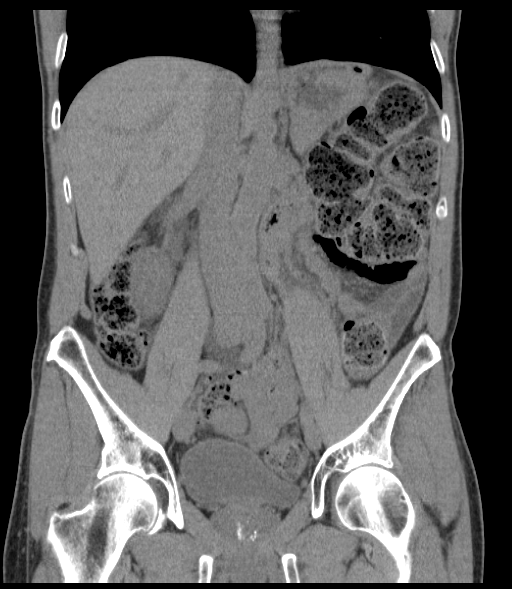

[16 of 46 positions shown; findings below may reference images not displayed]

FINDINGS: Lung bases are clear.

Liver is prominent, measuring 18.9 cm in length. No focal liver
lesions are identified on this noncontrast enhanced study.
Gallbladder wall is not thickened. There is no appreciable biliary
duct dilatation.

Spleen, pancreas, and adrenals appear normal.

Left kidney shows no evidence of mass or hydronephrosis. There is an
extrarenal pelvis the left, stable. There is no left-sided renal or
ureteral calculus. On the right, there is an extrarenal pelvis.
There is moderate hydronephrosis on the right as well. No right
renal mass. There is no intrarenal calculus on the right. There is a
2 mm calculus in the right ureterovesical junction. Several vascular
calcifications are also present on the right, separate from the
right ureter.

In the pelvis, the urinary bladder is midline with normal wall
thickness. There are prostatic calculi. Prostate appears mildly
prominent. There is no pelvic mass or pelvic fluid collection. There
is no periappendiceal region inflammation. Appendix is not well
seen.

There is diffuse stool throughout the colon.

There is no bowel obstruction.  No free air or portal venous air.

There is no appreciable ascites, adenopathy, or abscess in the
abdomen or pelvis. There is no abdominal aortic aneurysm. There is
degenerative change in the lumbar spine. There is slight
anterolisthesis of L3 on L4. There is vacuum phenomenon at L4-5.
There are no blastic or lytic bone lesions.
IMPRESSION: 2 mm calculus right ureterovesical junction with moderate
hydronephrosis on the right.

Extensive stool throughout colon consistent with constipation.

No bowel obstruction. No abscess. Periappendiceal region appears
within normal limits.

Multiple prostatic calculi. Prostate appears rather prominent. This
finding may warrant correlation with PSA.

## 2019-06-24 ENCOUNTER — Other Ambulatory Visit: Payer: Self-pay | Admitting: Urology

## 2019-06-24 DIAGNOSIS — R1031 Right lower quadrant pain: Secondary | ICD-10-CM

## 2019-07-13 ENCOUNTER — Ambulatory Visit: Payer: BLUE CROSS/BLUE SHIELD | Attending: Internal Medicine

## 2019-07-16 ENCOUNTER — Ambulatory Visit: Payer: BLUE CROSS/BLUE SHIELD

## 2020-02-01 DIAGNOSIS — Z1211 Encounter for screening for malignant neoplasm of colon: Secondary | ICD-10-CM | POA: Diagnosis not present

## 2020-02-01 DIAGNOSIS — Z0000 Encounter for general adult medical examination without abnormal findings: Secondary | ICD-10-CM | POA: Diagnosis not present

## 2020-02-01 DIAGNOSIS — N400 Enlarged prostate without lower urinary tract symptoms: Secondary | ICD-10-CM | POA: Diagnosis not present

## 2020-02-01 DIAGNOSIS — K5900 Constipation, unspecified: Secondary | ICD-10-CM | POA: Diagnosis not present

## 2020-02-10 DIAGNOSIS — K5900 Constipation, unspecified: Secondary | ICD-10-CM | POA: Diagnosis not present

## 2020-02-10 DIAGNOSIS — Z86010 Personal history of colonic polyps: Secondary | ICD-10-CM | POA: Diagnosis not present

## 2020-02-10 DIAGNOSIS — N400 Enlarged prostate without lower urinary tract symptoms: Secondary | ICD-10-CM | POA: Diagnosis not present

## 2020-03-01 DIAGNOSIS — K635 Polyp of colon: Secondary | ICD-10-CM | POA: Diagnosis not present

## 2020-03-01 DIAGNOSIS — D120 Benign neoplasm of cecum: Secondary | ICD-10-CM | POA: Diagnosis not present

## 2020-03-01 DIAGNOSIS — D122 Benign neoplasm of ascending colon: Secondary | ICD-10-CM | POA: Diagnosis not present

## 2020-03-01 DIAGNOSIS — Z86010 Personal history of colonic polyps: Secondary | ICD-10-CM | POA: Diagnosis not present

## 2020-09-14 DIAGNOSIS — R351 Nocturia: Secondary | ICD-10-CM | POA: Diagnosis not present

## 2020-09-14 DIAGNOSIS — N401 Enlarged prostate with lower urinary tract symptoms: Secondary | ICD-10-CM | POA: Diagnosis not present

## 2020-09-16 DIAGNOSIS — L111 Transient acantholytic dermatosis [Grover]: Secondary | ICD-10-CM | POA: Diagnosis not present

## 2020-09-16 DIAGNOSIS — L570 Actinic keratosis: Secondary | ICD-10-CM | POA: Diagnosis not present

## 2020-11-15 DIAGNOSIS — H52222 Regular astigmatism, left eye: Secondary | ICD-10-CM | POA: Diagnosis not present

## 2020-11-15 DIAGNOSIS — H2513 Age-related nuclear cataract, bilateral: Secondary | ICD-10-CM | POA: Diagnosis not present

## 2020-11-15 DIAGNOSIS — H524 Presbyopia: Secondary | ICD-10-CM | POA: Diagnosis not present

## 2020-11-15 DIAGNOSIS — H5201 Hypermetropia, right eye: Secondary | ICD-10-CM | POA: Diagnosis not present

## 2020-11-15 DIAGNOSIS — H43813 Vitreous degeneration, bilateral: Secondary | ICD-10-CM | POA: Diagnosis not present

## 2020-11-15 DIAGNOSIS — G43819 Other migraine, intractable, without status migrainosus: Secondary | ICD-10-CM | POA: Diagnosis not present

## 2021-02-01 DIAGNOSIS — Z0000 Encounter for general adult medical examination without abnormal findings: Secondary | ICD-10-CM | POA: Diagnosis not present

## 2021-02-01 DIAGNOSIS — B001 Herpesviral vesicular dermatitis: Secondary | ICD-10-CM | POA: Diagnosis not present

## 2021-02-01 DIAGNOSIS — R7989 Other specified abnormal findings of blood chemistry: Secondary | ICD-10-CM | POA: Diagnosis not present

## 2021-02-01 DIAGNOSIS — Z125 Encounter for screening for malignant neoplasm of prostate: Secondary | ICD-10-CM | POA: Diagnosis not present

## 2021-02-01 DIAGNOSIS — N400 Enlarged prostate without lower urinary tract symptoms: Secondary | ICD-10-CM | POA: Diagnosis not present

## 2021-02-01 DIAGNOSIS — K5900 Constipation, unspecified: Secondary | ICD-10-CM | POA: Diagnosis not present

## 2021-02-01 DIAGNOSIS — R7309 Other abnormal glucose: Secondary | ICD-10-CM | POA: Diagnosis not present

## 2021-02-01 DIAGNOSIS — F39 Unspecified mood [affective] disorder: Secondary | ICD-10-CM | POA: Diagnosis not present

## 2021-06-19 DIAGNOSIS — Z23 Encounter for immunization: Secondary | ICD-10-CM | POA: Diagnosis not present

## 2021-09-18 DIAGNOSIS — N401 Enlarged prostate with lower urinary tract symptoms: Secondary | ICD-10-CM | POA: Diagnosis not present

## 2021-09-18 DIAGNOSIS — R351 Nocturia: Secondary | ICD-10-CM | POA: Diagnosis not present

## 2022-01-30 DIAGNOSIS — Z125 Encounter for screening for malignant neoplasm of prostate: Secondary | ICD-10-CM | POA: Diagnosis not present

## 2022-01-30 DIAGNOSIS — R7989 Other specified abnormal findings of blood chemistry: Secondary | ICD-10-CM | POA: Diagnosis not present

## 2022-01-30 DIAGNOSIS — I10 Essential (primary) hypertension: Secondary | ICD-10-CM | POA: Diagnosis not present

## 2022-01-30 DIAGNOSIS — Z0000 Encounter for general adult medical examination without abnormal findings: Secondary | ICD-10-CM | POA: Diagnosis not present

## 2022-01-30 DIAGNOSIS — R7309 Other abnormal glucose: Secondary | ICD-10-CM | POA: Diagnosis not present

## 2022-01-30 DIAGNOSIS — R946 Abnormal results of thyroid function studies: Secondary | ICD-10-CM | POA: Diagnosis not present

## 2022-02-06 DIAGNOSIS — N400 Enlarged prostate without lower urinary tract symptoms: Secondary | ICD-10-CM | POA: Diagnosis not present

## 2022-02-06 DIAGNOSIS — Z0000 Encounter for general adult medical examination without abnormal findings: Secondary | ICD-10-CM | POA: Diagnosis not present

## 2022-02-06 DIAGNOSIS — R7309 Other abnormal glucose: Secondary | ICD-10-CM | POA: Diagnosis not present

## 2022-02-06 DIAGNOSIS — Z125 Encounter for screening for malignant neoplasm of prostate: Secondary | ICD-10-CM | POA: Diagnosis not present

## 2022-02-06 DIAGNOSIS — Z23 Encounter for immunization: Secondary | ICD-10-CM | POA: Diagnosis not present

## 2022-02-06 DIAGNOSIS — F39 Unspecified mood [affective] disorder: Secondary | ICD-10-CM | POA: Diagnosis not present

## 2022-02-06 DIAGNOSIS — R7989 Other specified abnormal findings of blood chemistry: Secondary | ICD-10-CM | POA: Diagnosis not present

## 2022-09-10 DIAGNOSIS — N401 Enlarged prostate with lower urinary tract symptoms: Secondary | ICD-10-CM | POA: Diagnosis not present

## 2022-09-17 DIAGNOSIS — N401 Enlarged prostate with lower urinary tract symptoms: Secondary | ICD-10-CM | POA: Diagnosis not present

## 2022-09-17 DIAGNOSIS — R351 Nocturia: Secondary | ICD-10-CM | POA: Diagnosis not present

## 2022-11-23 DIAGNOSIS — H5203 Hypermetropia, bilateral: Secondary | ICD-10-CM | POA: Diagnosis not present

## 2022-11-23 DIAGNOSIS — H2513 Age-related nuclear cataract, bilateral: Secondary | ICD-10-CM | POA: Diagnosis not present

## 2023-02-05 DIAGNOSIS — Z125 Encounter for screening for malignant neoplasm of prostate: Secondary | ICD-10-CM | POA: Diagnosis not present

## 2023-02-05 DIAGNOSIS — R7309 Other abnormal glucose: Secondary | ICD-10-CM | POA: Diagnosis not present

## 2023-02-05 DIAGNOSIS — R7989 Other specified abnormal findings of blood chemistry: Secondary | ICD-10-CM | POA: Diagnosis not present

## 2023-02-05 DIAGNOSIS — Z79899 Other long term (current) drug therapy: Secondary | ICD-10-CM | POA: Diagnosis not present

## 2023-02-12 DIAGNOSIS — Z79899 Other long term (current) drug therapy: Secondary | ICD-10-CM | POA: Diagnosis not present

## 2023-02-12 DIAGNOSIS — R7309 Other abnormal glucose: Secondary | ICD-10-CM | POA: Diagnosis not present

## 2023-02-12 DIAGNOSIS — R7989 Other specified abnormal findings of blood chemistry: Secondary | ICD-10-CM | POA: Diagnosis not present

## 2023-02-12 DIAGNOSIS — N400 Enlarged prostate without lower urinary tract symptoms: Secondary | ICD-10-CM | POA: Diagnosis not present

## 2023-02-12 DIAGNOSIS — Z0000 Encounter for general adult medical examination without abnormal findings: Secondary | ICD-10-CM | POA: Diagnosis not present

## 2023-02-12 DIAGNOSIS — Z23 Encounter for immunization: Secondary | ICD-10-CM | POA: Diagnosis not present

## 2023-02-12 DIAGNOSIS — F39 Unspecified mood [affective] disorder: Secondary | ICD-10-CM | POA: Diagnosis not present

## 2023-09-10 DIAGNOSIS — Z125 Encounter for screening for malignant neoplasm of prostate: Secondary | ICD-10-CM | POA: Diagnosis not present

## 2023-09-17 DIAGNOSIS — N401 Enlarged prostate with lower urinary tract symptoms: Secondary | ICD-10-CM | POA: Diagnosis not present

## 2023-09-17 DIAGNOSIS — R351 Nocturia: Secondary | ICD-10-CM | POA: Diagnosis not present

## 2023-11-25 DIAGNOSIS — H2513 Age-related nuclear cataract, bilateral: Secondary | ICD-10-CM | POA: Diagnosis not present

## 2023-11-25 DIAGNOSIS — H5203 Hypermetropia, bilateral: Secondary | ICD-10-CM | POA: Diagnosis not present

## 2024-02-11 DIAGNOSIS — Z79899 Other long term (current) drug therapy: Secondary | ICD-10-CM | POA: Diagnosis not present

## 2024-02-11 DIAGNOSIS — R7309 Other abnormal glucose: Secondary | ICD-10-CM | POA: Diagnosis not present

## 2024-02-11 DIAGNOSIS — R7989 Other specified abnormal findings of blood chemistry: Secondary | ICD-10-CM | POA: Diagnosis not present

## 2024-02-11 DIAGNOSIS — Z125 Encounter for screening for malignant neoplasm of prostate: Secondary | ICD-10-CM | POA: Diagnosis not present

## 2024-02-18 DIAGNOSIS — Z79899 Other long term (current) drug therapy: Secondary | ICD-10-CM | POA: Diagnosis not present

## 2024-02-18 DIAGNOSIS — Z0000 Encounter for general adult medical examination without abnormal findings: Secondary | ICD-10-CM | POA: Diagnosis not present

## 2024-02-18 DIAGNOSIS — F39 Unspecified mood [affective] disorder: Secondary | ICD-10-CM | POA: Diagnosis not present

## 2024-02-18 DIAGNOSIS — N400 Enlarged prostate without lower urinary tract symptoms: Secondary | ICD-10-CM | POA: Diagnosis not present

## 2024-02-18 DIAGNOSIS — R7989 Other specified abnormal findings of blood chemistry: Secondary | ICD-10-CM | POA: Diagnosis not present

## 2024-02-18 DIAGNOSIS — Z23 Encounter for immunization: Secondary | ICD-10-CM | POA: Diagnosis not present

## 2024-02-18 DIAGNOSIS — Z13220 Encounter for screening for lipoid disorders: Secondary | ICD-10-CM | POA: Diagnosis not present

## 2024-02-18 DIAGNOSIS — R7309 Other abnormal glucose: Secondary | ICD-10-CM | POA: Diagnosis not present
